# Patient Record
Sex: Female | Born: 1975 | Race: Black or African American | Hispanic: No | Marital: Married | Smoking: Never smoker
Health system: Southern US, Community
[De-identification: ages and names within clinical notes are randomized; demographics above are authoritative.]

## PROBLEM LIST (undated history)

## (undated) HISTORY — PX: TUBAL LIGATION: SHX77

---

## 1998-01-12 ENCOUNTER — Emergency Department (HOSPITAL_COMMUNITY): Admission: EM | Admit: 1998-01-12 | Discharge: 1998-01-12 | Payer: Self-pay | Admitting: Emergency Medicine

## 1998-03-03 ENCOUNTER — Ambulatory Visit (HOSPITAL_COMMUNITY): Admission: RE | Admit: 1998-03-03 | Discharge: 1998-03-03 | Payer: Self-pay | Admitting: Family Medicine

## 2000-06-08 ENCOUNTER — Ambulatory Visit (HOSPITAL_COMMUNITY): Admission: RE | Admit: 2000-06-08 | Discharge: 2000-06-08 | Payer: Self-pay | Admitting: Family Medicine

## 2000-06-08 ENCOUNTER — Encounter: Payer: Self-pay | Admitting: Family Medicine

## 2000-06-08 ENCOUNTER — Encounter: Admission: RE | Admit: 2000-06-08 | Discharge: 2000-06-08 | Payer: Self-pay | Admitting: Family Medicine

## 2000-10-24 ENCOUNTER — Emergency Department (HOSPITAL_COMMUNITY): Admission: EM | Admit: 2000-10-24 | Discharge: 2000-10-24 | Payer: Self-pay | Admitting: Emergency Medicine

## 2000-10-25 ENCOUNTER — Ambulatory Visit (HOSPITAL_COMMUNITY): Admission: RE | Admit: 2000-10-25 | Discharge: 2000-10-25 | Payer: Self-pay | Admitting: Emergency Medicine

## 2001-04-19 ENCOUNTER — Encounter: Payer: Self-pay | Admitting: Obstetrics and Gynecology

## 2001-04-19 ENCOUNTER — Inpatient Hospital Stay (HOSPITAL_COMMUNITY): Admission: AD | Admit: 2001-04-19 | Discharge: 2001-04-19 | Payer: Self-pay | Admitting: Obstetrics and Gynecology

## 2001-04-23 ENCOUNTER — Encounter (INDEPENDENT_AMBULATORY_CARE_PROVIDER_SITE_OTHER): Payer: Self-pay

## 2001-04-23 ENCOUNTER — Inpatient Hospital Stay (HOSPITAL_COMMUNITY): Admission: AD | Admit: 2001-04-23 | Discharge: 2001-04-23 | Payer: Self-pay | Admitting: *Deleted

## 2001-04-26 ENCOUNTER — Ambulatory Visit: Admission: RE | Admit: 2001-04-26 | Discharge: 2001-04-26 | Payer: Self-pay | Admitting: Obstetrics and Gynecology

## 2001-04-26 ENCOUNTER — Encounter (INDEPENDENT_AMBULATORY_CARE_PROVIDER_SITE_OTHER): Payer: Self-pay | Admitting: *Deleted

## 2001-05-10 ENCOUNTER — Encounter: Payer: Self-pay | Admitting: Emergency Medicine

## 2001-05-10 ENCOUNTER — Emergency Department (HOSPITAL_COMMUNITY): Admission: EM | Admit: 2001-05-10 | Discharge: 2001-05-10 | Payer: Self-pay | Admitting: Emergency Medicine

## 2002-01-29 ENCOUNTER — Emergency Department (HOSPITAL_COMMUNITY): Admission: EM | Admit: 2002-01-29 | Discharge: 2002-01-29 | Payer: Self-pay | Admitting: Emergency Medicine

## 2002-02-09 ENCOUNTER — Inpatient Hospital Stay (HOSPITAL_COMMUNITY): Admission: AD | Admit: 2002-02-09 | Discharge: 2002-02-09 | Payer: Self-pay | Admitting: *Deleted

## 2002-02-12 ENCOUNTER — Inpatient Hospital Stay (HOSPITAL_COMMUNITY): Admission: AD | Admit: 2002-02-12 | Discharge: 2002-02-12 | Payer: Self-pay | Admitting: Obstetrics and Gynecology

## 2002-02-13 ENCOUNTER — Encounter: Payer: Self-pay | Admitting: Obstetrics and Gynecology

## 2002-02-13 HISTORY — PX: DILATION AND CURETTAGE OF UTERUS: SHX78

## 2002-03-28 ENCOUNTER — Other Ambulatory Visit: Admission: RE | Admit: 2002-03-28 | Discharge: 2002-03-28 | Payer: Self-pay | Admitting: Obstetrics and Gynecology

## 2002-05-06 ENCOUNTER — Encounter: Payer: Self-pay | Admitting: Obstetrics and Gynecology

## 2002-05-06 ENCOUNTER — Ambulatory Visit (HOSPITAL_COMMUNITY): Admission: RE | Admit: 2002-05-06 | Discharge: 2002-05-06 | Payer: Self-pay | Admitting: Obstetrics and Gynecology

## 2002-06-24 ENCOUNTER — Encounter: Payer: Self-pay | Admitting: Obstetrics and Gynecology

## 2002-06-24 ENCOUNTER — Ambulatory Visit (HOSPITAL_COMMUNITY): Admission: RE | Admit: 2002-06-24 | Discharge: 2002-06-24 | Payer: Self-pay | Admitting: Obstetrics and Gynecology

## 2002-08-12 ENCOUNTER — Encounter: Payer: Self-pay | Admitting: Obstetrics and Gynecology

## 2002-08-12 ENCOUNTER — Ambulatory Visit (HOSPITAL_COMMUNITY): Admission: RE | Admit: 2002-08-12 | Discharge: 2002-08-12 | Payer: Self-pay | Admitting: Obstetrics and Gynecology

## 2002-09-14 ENCOUNTER — Inpatient Hospital Stay (HOSPITAL_COMMUNITY): Admission: AD | Admit: 2002-09-14 | Discharge: 2002-09-16 | Payer: Self-pay | Admitting: Obstetrics and Gynecology

## 2002-09-15 ENCOUNTER — Encounter (INDEPENDENT_AMBULATORY_CARE_PROVIDER_SITE_OTHER): Payer: Self-pay

## 2002-11-30 ENCOUNTER — Emergency Department (HOSPITAL_COMMUNITY): Admission: EM | Admit: 2002-11-30 | Discharge: 2002-11-30 | Payer: Self-pay | Admitting: Emergency Medicine

## 2004-05-20 ENCOUNTER — Emergency Department (HOSPITAL_COMMUNITY): Admission: EM | Admit: 2004-05-20 | Discharge: 2004-05-20 | Payer: Self-pay | Admitting: Emergency Medicine

## 2004-05-21 ENCOUNTER — Emergency Department (HOSPITAL_COMMUNITY): Admission: EM | Admit: 2004-05-21 | Discharge: 2004-05-21 | Payer: Self-pay | Admitting: Emergency Medicine

## 2004-07-14 ENCOUNTER — Emergency Department: Payer: Self-pay | Admitting: Emergency Medicine

## 2004-07-26 ENCOUNTER — Emergency Department: Payer: Self-pay | Admitting: Emergency Medicine

## 2005-01-09 ENCOUNTER — Emergency Department (HOSPITAL_COMMUNITY): Admission: EM | Admit: 2005-01-09 | Discharge: 2005-01-09 | Payer: Self-pay | Admitting: Family Medicine

## 2005-11-05 ENCOUNTER — Emergency Department (HOSPITAL_COMMUNITY): Admission: EM | Admit: 2005-11-05 | Discharge: 2005-11-05 | Payer: Self-pay | Admitting: Emergency Medicine

## 2006-01-20 ENCOUNTER — Emergency Department (HOSPITAL_COMMUNITY): Admission: EM | Admit: 2006-01-20 | Discharge: 2006-01-20 | Payer: Self-pay | Admitting: Emergency Medicine

## 2007-10-08 ENCOUNTER — Encounter: Admission: RE | Admit: 2007-10-08 | Discharge: 2007-10-08 | Payer: Self-pay | Admitting: Orthopedic Surgery

## 2010-07-01 NOTE — Op Note (Signed)
[name], Kayla Andrews                         ACCOUNT NO.:  [id number]   MEDICAL RECORD NO.:  [id number]                   PATIENT TYPE:  INP   LOCATION:  9148                                 FACILITY:  [facility]   PHYSICIAN:  Hal Morales, M.D.             DATE OF BIRTH:  06-13-1975   DATE OF PROCEDURE:  09/15/2002  DATE OF DISCHARGE:                                 OPERATIVE REPORT   PREOPERATIVE DIAGNOSIS:  Desire for surgical sterilization.   POSTOPERATIVE DIAGNOSIS:  Desire for surgical sterilization.   OPERATION:  Postpartum tubal sterilization.   SURGEON:  Hal Morales, M.D.   ANESTHESIA:  General orotracheal.   ESTIMATED BLOOD LOSS:  Less than 25 mL.   COMPLICATIONS:  None.   FINDINGS:  The tubes appeared normal for the postpartum state.   DESCRIPTION OF PROCEDURE:  A discussion was held with the patient concerning  the indication for her procedure which is her desire for surgical  sterilization and the risks involved, which include but are not limited to,  anesthesia, bleeding, infection, damage to adjacent organs, and  sterilization failure resulting in subsequent pregnancy.  The patient  verbalized understanding of each of these risks and wished to proceed.  She  was thus taken to the operating room after appropriate identification and  placed on the operating table.   After the attainment of adequate general anesthesia, the abdomen and  perineum were prepped with multiple layers of Betadine.  A red Robinson  catheter was used to empty the bladder.  The abdomen was draped as a sterile  field.  A subumbilical injection of 0.25% Marcaine for a total of 10 mL was  undertaken.  A subumbilical incision was made and the abdomen opened in  layers to enter the peritoneum.  The left fallopian tube was then  identified, followed to his fimbriated end, then grasped at the isthmic  portion and elevated.  A suture of 2-0 chromic was placed through the  mesosalpinx and  tied fore and aft on the knuckle of tube.  A second ligature  was placed proximal to that and the intervening knuckle of tube excised.  The cut ends were cauterized and hemostasis noted to be adequate.  A similar  procedure was carried out on the opposite side.  The abdominal peritoneum  was closed in a pursestring fashion with a suture of 0 Vicryl.  The fascia  was closed in a running fashion with a suture of 0 Vicryl.  A subcutaneous  tissue suture of 2-0 chromic was used to reapproximate the subcutaneous  tissue and a subcuticular suture of 3-0 Vicryl used to close the skin  incision.  A Steri-Strip and sterile dressing were applied and the patient  awakened from  general anesthesia, then taken to the recovery room in satisfactory  condition having tolerated the procedure well with sponge and instrument  counts correct.   SPECIMENS:  Portions  of right and left tube were sent to pathology.                                               Hal Morales, M.D.    VPH/MEDQ  D:  09/15/2002  T:  09/15/2002  Job:  [id number]

## 2010-07-01 NOTE — Op Note (Signed)
[facility]  Patient:    BRENNLEY, Kayla Andrews Visit Number: [id number] MRN: [id number]          Service Type: OBS Location: [facility] Attending Physician:  Michael Litter Dictated by:   Janine Limbo, M.D. Proc. Date: 04/26/01 Admit Date:  04/23/2001 Discharge Date: 04/23/2001                             Operative Report  PREOPERATIVE DIAGNOSIS:       First trimester missed abortion.  POSTOPERATIVE DIAGNOSIS:      First trimester missed abortion.  PROCEDURE:                    Suction dilatation and evacuation.  SURGEON:                      Janine Limbo, M.D.  ANESTHESIA:                   Monitored anesthetic control with paracervical block using 0.5% Marcaine.  INDICATIONS:                  The patient is a 35 year old female, gravida 5, para 1-0-3-1 who presents with a first trimester missed AB.  She understands the indications for her procedure and she accepts the risks of (but not limited to) anesthetic complications, bleeding, infection and possible damage to surrounding organs.  FINDINGS:                     The patients blood type was O positive.  A small amount of products of conception was removed from within the uterine cavity.  The cervix was open.  DESCRIPTION OF PROCEDURE:     The patient was taken to the operating room, where she was given medications through her IV line.  The perineum and vagina were prepped with multiple layers of Betadine.  The bladder was drained of urine.  Examination under anesthesia was performed.  The patient was sterilely draped.  Paracervical block was placed using 10 cc of 0.5% Marcaine.  The uterus sounded to 7 cm.  The cervix was noted to be dilated.  The cervix was dilated further without difficulty.  The uterine cavity was then evacuated using a size 8 suction curet and then a medium sharp curet.  The cavity was felt to be clean at the end of our procedure.  Hemostasis was noted to  be adequate.  All instruments were removed.  Repeat examination showed a firm uterus that was approximately 6-8 weeks in size.  The patient was awakened from her anesthetic and taken to the recovery room in stable condition.  FOLLOW-UP INSTRUCTIONS:       The patient was given a prescription for Vicodin, 1-2 tablets every four hours as needed for pain.  She was given a total of 20 tablets.  No refills were authorized.  The patient will return to see Dr. Stefano Gaul in 2-3 weeks for follow-up examination.  She was given a copy of the postoperative instruction sheet as prepared by the [facility] for patients who have undergone a dilatation and curettage.  The patient is authorized to return to work on April 29, 2001. Dictated by:   Janine Limbo, M.D. Attending Physician:  Michael Litter DD:  04/26/01 TD:  04/27/01 Job: [id number]

## 2010-07-01 NOTE — Discharge Summary (Signed)
NAME:  Kayla Andrews, Kayla Andrews                         ACCOUNT NO.:  [id number]   MEDICAL RECORD NO.:  [id number]                   PATIENT TYPE:  INP   LOCATION:  9148                                 FACILITY:  [facility]   PHYSICIAN:  Naima A. Dillard, M.D.              DATE OF BIRTH:  07/21/1975   DATE OF ADMISSION:  09/14/2002  DATE OF DISCHARGE:  09/16/2002                                 DISCHARGE SUMMARY   ADMISSION DIAGNOSES:  1. Intrauterine pregnancy at 67 and one-sevenths weeks.  2. Early active labor.   DISCHARGE DIAGNOSES:  1. Intrauterine pregnancy at 68 and one-sevenths weeks.  2. Early active labor.  3. Status post precipitous home delivery of a viable female infant named     Chrissie Noa weighing 7 pounds 3 ounces, Apgars 9 and 9.  4. Desires elective sterilization.  5. Status post elective bilateral tubal ligation.   HOSPITAL PROCEDURES:  1. Precipitous home delivery, spontaneous vaginal delivery of a female infant.  2. General orotracheal anesthesia.  3. Postpartum bilateral tubal ligation.   HOSPITAL COURSE:  The patient was admitted initially in early active labor  and was contracting every five minutes and her cervix was 4 cm, 70% effaced,  and -2 station.  She was admitted to labor and delivery and through the  night her contractions decreased and became milder.  The decision was made  at that point to discharge her home because her cervix had remained  unchanged and her contractions had become irregular.  She was given Ambien  to take for sleep.  Signs and symptoms of labor were reviewed and the  patient was discharged approximately 11 a.m.  As she proceeded home her  labor began to strengthen and progress.  Once she arrived at home her  contractions had increased significantly and EMS was called.  As they  arrived they delivered the female infant precipitously.  The patient and baby  were transported to the hospital where the placenta was delivered  spontaneously without  complications.  The perineum was found to be intact  except for a first degree vaginal laceration which was well approximated and  not bleeding and left unrepaired.  On postpartum day #1 the patient  underwent an elective bilateral tubal ligation under general anesthesia by  Dr. Pennie Rushing with no complications.  On postpartum day #2 and postoperative  day #1 the patient was ready to go home.  She was breast feeding well, vital  signs were stable.  She was afebrile, heart rate regular rate and rhythm,  chest clear.  Fundus was firm, lochia small to moderate, umbilical incision  was clean and intact.  Abdomen was soft and appropriately tender.  Extremities were within normal limits and she was deemed to have received  full benefit of her hospital stay and was discharged home.   DISCHARGE LABORATORY DATA:  White blood cell count 11.2, hemoglobin 10.5,  platelets 240.  DISCHARGE MEDICATIONS:  1. Motrin 600 mg p.o. q.6h. p.r.n.  2. Tylox one to two p.o. q.4h. p.r.n.    DISCHARGE INSTRUCTIONS:  Per [facility] handout.   DISCHARGE FOLLOW-UP:  In six weeks or p.r.n.     Marie L. Williams, C.N.M.                 Naima A. Normand Sloop, M.D.    MLW/MEDQ  D:  09/16/2002  T:  09/16/2002  Job:  [id number]

## 2010-07-01 NOTE — Discharge Summary (Signed)
   NAME:  Kayla Andrews, Kayla Andrews                         ACCOUNT NO.:  [id number]   MEDICAL RECORD NO.:  [id number]                   PATIENT TYPE:  INP   LOCATION:  9167                                 FACILITY:  [facility]   PHYSICIAN:  Naima A. Dillard, M.D.              DATE OF BIRTH:  01-Feb-1976   DATE OF ADMISSION:  09/14/2002  DATE OF DISCHARGE:  09/14/2002                                 DISCHARGE SUMMARY   Ms. [name] is a gravida 6, para 1, 0, 4, 1 at [redacted] weeks gestation who  presented earlier this morning with regular uterine contractions.  She was  found to be 4 cm dilated at that time and was admitted in labor.  Since that  time her baby has remained reactive and her contractions have become  irregular with uterine irritability predominantly.  She is sleeping soundly  with this irritability and her labor has not progressed.  On cervical exam  her cervix is found to be 4 cm dilated, 70% effaced with the cephalic  presenting part at a -2 station which is unchanged.  Membranes are intact.  The patient's vital signs are stable and she is to be discharged home.   DISCHARGE MEDICATIONS:  Ambien 10 mg p.o.   DISCHARGE INSTRUCTIONS:  She will call for any increased signs or symptoms  of labor, any warning signs or any problems or concerns.  Otherwise she will  keep her next schedule appointment at the office [facility] this week.       Rica Koyanagi, C.N.M.               Naima A. Normand Sloop, M.D.    SDM/MEDQ  D:  09/14/2002  T:  09/15/2002  Job:  [id number]

## 2010-07-01 NOTE — H&P (Signed)
NAME:  Kayla Andrews, Kayla Andrews                         ACCOUNT NO.:  [id number]   MEDICAL RECORD NO.:  [id number]                   PATIENT TYPE:  INP   LOCATION:  9148                                 [facility]   PHYSICIAN:  Naima A. Dillard, M.D.              DATE OF BIRTH:  28-Jul-1975   DATE OF ADMISSION:  09/14/2002  DATE OF DISCHARGE:                                HISTORY & PHYSICAL   HISTORY OF PRESENT ILLNESS:  Ms. Kayla Andrews is a 35 year old single black  female gravida 6, para 1, 0/4/1 at 39-1/7 weeks who presents with regular  uterine contractions every 5 minutes since this morning. She denies leaking,  bleeding, headache, nausea and vomiting, visual disturbances. Her pregnancy  has been followed by the [facility]  [facility] and has been remarkable for 1. Late  transfer. 2. Obesity.  3. AB x4.  4. Group B strep negative.   PRENATAL LABORATORY DATA:  Her prenatal labs were collected on, I am not  sure what date, but they are hemoglobin 11.5, hematocrit 33.5, platelets  235,000. Blood type O positive. Antibody negative. RPR non-reactive. Rubella  immune. Hepatitis B surface antigen negative. HIV non-reactive. Cystic  fibrosis negative. Gonorrhea negative. Chlamydia negative. Her quad screen  was within normal limits. On Jun 19, 2002 her 1 hour Glucola was 103 and her  hemoglobin at that time was 11.6. Culture of the vaginal tract for group B  strep on August 26, 2002 was negative and her gonorrhea and Chlamydia at that  same time was negative.   HOSPITAL COURSE:  She presented for care at [facility] on March 28, 2002 at [redacted] weeks gestation. She had an 18 week Glucola secondary to obesity  that was within normal limits. Pregnancy ultrasonography at [redacted] weeks  gestation showed growth consistent with previous dating. Second pregnancy  ultrasonography at [redacted] weeks gestation showed estimated fetal weight at 75th  to 90th  percentile and all anatomy was visualized. Pregnancy ultrasonography  at [redacted] weeks gestation showed growth consistent with previous dating. The  rest of her prenatal care was unremarkable.   OBSTETRIC HISTORY:  She is a gravida 6, para 1, 0/4/1 and in November of  1997, had a vaginal delivery of a female infant weighing 7 pounds and 8  ounces at [redacted] weeks gestation. After 6 hours of labor, she had an epidural  for anesthesia. Infant's name was [place] in 1998 and in 2003, she had SAB's.  In 2002 and 2003 had EAB's.   ALLERGIES:  PENICILLIN (allergy resulting in hives.   PAST MEDICAL HISTORY:  She has used condoms in the past for contraception.  She reports having had the usual childhood illnesses. She has an occasional  urinary tract infection. She has a history of marijuana use.   FAMILY HISTORY:  Remarkable for a father with heart disease and myocardial  infarction as  well as chronic hypertension and varicosities. Daughter with  history of asthma. Father with diabetes. Maternal grandfather with liver  cancer. Maternal grandmother with brain cancer. Maternal grandfather with  history of cerebrovascular. Brother with seizure disorder of childhood.   GENETIC HISTORY:  The patient's maternal aunt had twins.   SOCIAL HISTORY:  Father of the baby is involved. His name is Kayla Andrews. The  patient has 2 years of college education. Father of the baby is high school  educated and both are employed full time. They deny any alcohol, tobacco or  illicit drug use.   PHYSICAL EXAMINATION:  VITAL SIGNS: Stable. She is afebrile.  HEENT: Within normal limits.  CHEST: Clear to auscultation.  HEART: Regular rate and rhythm.  ABDOMEN: Gravid and contour with fundal height extending approximately 39 cm  above the pubic synthesis. Electronic fetal monitoring shows reassuring  fetal heart rate. Uterine contractions every 5 minutes. Cervical examination  4 cm, 70%, vertex, and minus 2.  EXTREMITIES: Within  normal limits.   ASSESSMENT:  1. Intrauterine pregnancy  at term.  2. Early active labor.   PLAN:  1. Admit to birthing suite for comfort with Dr. Normand Sloop.  2. Routine Certified Nurse Midwife orders.  3. Anticipate normal spontaneous vaginal birth.     Cam Hai, C.N.M.                     Naima A. Normand Sloop, M.D.    KS/MEDQ  D:  09/14/2002  T:  09/14/2002  Job:  [id number]

## 2012-12-09 ENCOUNTER — Emergency Department (INDEPENDENT_AMBULATORY_CARE_PROVIDER_SITE_OTHER)
Admission: EM | Admit: 2012-12-09 | Discharge: 2012-12-09 | Disposition: A | Discharge status: Home / Self Care | Payer: Self-pay | Source: Home / Self Care | Attending: Family Medicine | Admitting: Family Medicine

## 2012-12-09 ENCOUNTER — Encounter (HOSPITAL_COMMUNITY): Payer: Self-pay | Admitting: Emergency Medicine

## 2012-12-09 DIAGNOSIS — M549 Dorsalgia, unspecified: Secondary | ICD-10-CM

## 2012-12-09 DIAGNOSIS — M542 Cervicalgia: Secondary | ICD-10-CM
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
## 2013-11-06 ENCOUNTER — Encounter (HOSPITAL_COMMUNITY): Payer: Self-pay | Admitting: Emergency Medicine

## 2013-11-06 ENCOUNTER — Emergency Department (HOSPITAL_COMMUNITY)
Admission: EM | Admit: 2013-11-06 | Discharge: 2013-11-07 | Disposition: A | Discharge status: Home / Self Care | Payer: Self-pay | Attending: Emergency Medicine | Admitting: Emergency Medicine

## 2013-11-06 DIAGNOSIS — Z23 Encounter for immunization: Secondary | ICD-10-CM | POA: Insufficient documentation

## 2013-11-06 DIAGNOSIS — S0083XA Contusion of other part of head, initial encounter: Secondary | ICD-10-CM | POA: Insufficient documentation

## 2013-11-06 DIAGNOSIS — S0993XA Unspecified injury of face, initial encounter: Secondary | ICD-10-CM | POA: Insufficient documentation

## 2013-11-06 DIAGNOSIS — S0181XA Laceration without foreign body of other part of head, initial encounter: Secondary | ICD-10-CM

## 2013-11-06 DIAGNOSIS — S199XXA Unspecified injury of neck, initial encounter: Secondary | ICD-10-CM

## 2013-11-06 DIAGNOSIS — S0003XA Contusion of scalp, initial encounter: Secondary | ICD-10-CM | POA: Insufficient documentation

## 2013-11-06 DIAGNOSIS — R51 Headache: Secondary | ICD-10-CM | POA: Insufficient documentation

## 2013-11-06 DIAGNOSIS — S1093XA Contusion of unspecified part of neck, initial encounter: Secondary | ICD-10-CM

## 2013-11-06 DIAGNOSIS — S058X9A Other injuries of unspecified eye and orbit, initial encounter: Secondary | ICD-10-CM | POA: Insufficient documentation

## 2013-11-06 MED ORDER — TETANUS-DIPHTH-ACELL PERTUSSIS 5-2.5-18.5 LF-MCG/0.5 IM SUSP
0.5000 mL | Freq: Once | INTRAMUSCULAR | Status: AC
  Administered 2013-11-06: 0.5 mL via INTRAMUSCULAR
  Filled 2013-11-06: qty 0.5

## 2013-11-06 MED ORDER — ONDANSETRON 4 MG PO TBDP
4.0000 mg | ORAL_TABLET | Freq: Once | ORAL | Status: AC
  Administered 2013-11-06: 4 mg via ORAL
  Filled 2013-11-06: qty 1

## 2013-11-06 MED ORDER — ACETAMINOPHEN 325 MG PO TABS
650.0000 mg | ORAL_TABLET | Freq: Once | ORAL | Status: AC
  Administered 2013-11-06: 650 mg via ORAL
  Filled 2013-11-06: qty 2

## 2013-11-06 MED ORDER — TETRACAINE HCL 0.5 % OP SOLN
2.0000 [drp] | Freq: Once | OPHTHALMIC | Status: AC
  Administered 2013-11-06: 2 [drp] via OPHTHALMIC
  Filled 2013-11-06: qty 2

## 2013-11-06 MED ORDER — FLUORESCEIN SODIUM 1 MG OP STRP
1.0000 | ORAL_STRIP | Freq: Once | OPHTHALMIC | Status: AC
  Administered 2013-11-06: via OPHTHALMIC
  Filled 2013-11-06: qty 1
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
## 2013-11-06 NOTE — ED Notes (Signed)
Patient stated that her head is hurting and is feeling nauseated.

## 2013-11-06 NOTE — ED Notes (Signed)
Ice pack given for left eye.

## 2013-11-07 ENCOUNTER — Encounter (HOSPITAL_COMMUNITY): Payer: Self-pay | Admitting: Radiology

## 2013-11-07 ENCOUNTER — Emergency Department (HOSPITAL_COMMUNITY): Payer: Self-pay

## 2013-11-07 MED ORDER — OXYCODONE-ACETAMINOPHEN 5-325 MG PO TABS: 1.0000 | ORAL_TABLET | Freq: Four times a day (QID) | ORAL | Status: DC | PRN

## 2013-11-07 MED ORDER — OXYCODONE-ACETAMINOPHEN 5-325 MG PO TABS: 1.0000 | ORAL_TABLET | Freq: Four times a day (QID) | ORAL | Status: AC | PRN
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
## 2013-11-07 NOTE — Discharge Instructions (Signed)
Contusion °A contusion is a deep bruise. Contusions are the result of an injury that caused bleeding under the skin. The contusion may turn blue, purple, or yellow. Minor injuries will give you a painless contusion, but more severe contusions may stay painful and swollen for a few weeks.  °CAUSES  °A contusion is usually caused by a blow, trauma, or direct force to an area of the body. °SYMPTOMS  °· Swelling and redness of the injured area. °· Bruising of the injured area. °· Tenderness and soreness of the injured area. °· Pain. °DIAGNOSIS  °The diagnosis can be made by taking a history and physical exam. An X-ray, CT scan, or MRI may be needed to determine if there were any associated injuries, such as fractures. °TREATMENT  °Specific treatment will depend on what area of the body was injured. In general, the best treatment for a contusion is resting, icing, elevating, and applying cold compresses to the injured area. Over-the-counter medicines may also be recommended for pain control. Ask your caregiver what the best treatment is for your contusion. °HOME CARE INSTRUCTIONS  °· Put ice on the injured area. °¨ Put ice in a plastic bag. °¨ Place a towel between your skin and the bag. °¨ Leave the ice on for 15-20 minutes, 3-4 times a day, or as directed by your health care provider. °· Only take over-the-counter or prescription medicines for pain, discomfort, or fever as directed by your caregiver. Your caregiver may recommend avoiding anti-inflammatory medicines (aspirin, ibuprofen, and naproxen) for 48 hours because these medicines may increase bruising. °· Rest the injured area. °· If possible, elevate the injured area to reduce swelling. °SEEK IMMEDIATE MEDICAL CARE IF:  °· You have increased bruising or swelling. °· You have pain that is getting worse. °· Your swelling or pain is not relieved with medicines. °MAKE SURE YOU:  °· Understand these instructions. °· Will watch your condition. °· Will get help right  away if you are not doing well or get worse. °Document Released: 11/09/2004 Document Revised: 02/04/2013 Document Reviewed: 12/05/2010 °[employer]® Patient Information ©2015 [employer]. This information is not intended to replace advice given to you by your health care provider. Make sure you discuss any questions you have with your health care provider. ° °
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
## 2016-06-06 ENCOUNTER — Ambulatory Visit (HOSPITAL_COMMUNITY)
Admission: EM | Admit: 2016-06-06 | Discharge: 2016-06-06 | Disposition: A | Discharge status: Home / Self Care | Payer: Medicaid Other | Attending: Internal Medicine | Admitting: Internal Medicine

## 2016-06-06 ENCOUNTER — Encounter (HOSPITAL_COMMUNITY): Payer: Self-pay | Admitting: Emergency Medicine

## 2016-06-06 DIAGNOSIS — R059 Cough, unspecified: Secondary | ICD-10-CM

## 2016-06-06 DIAGNOSIS — J9801 Acute bronchospasm: Secondary | ICD-10-CM | POA: Diagnosis not present

## 2016-06-06 DIAGNOSIS — R05 Cough: Secondary | ICD-10-CM

## 2016-06-06 DIAGNOSIS — J301 Allergic rhinitis due to pollen: Secondary | ICD-10-CM | POA: Diagnosis not present

## 2016-06-06 DIAGNOSIS — R0982 Postnasal drip: Secondary | ICD-10-CM | POA: Diagnosis not present

## 2016-06-06 MED ORDER — ALBUTEROL SULFATE HFA 108 (90 BASE) MCG/ACT IN AERS: 2.0000 | INHALATION_SPRAY | RESPIRATORY_TRACT | 0 refills | Status: AC | PRN

## 2016-06-06 MED ORDER — PREDNISONE 50 MG PO TABS: ORAL_TABLET | ORAL | 0 refills | Status: DC

## 2016-06-06 NOTE — ED Triage Notes (Signed)
Pt has been suffering from a sore throat, cough, nasal congestion and a headache for 3 days.

## 2016-06-06 NOTE — ED Provider Notes (Signed)
CSN: [id number]     Arrival date & time 06/06/16  1131 History   First MD Initiated Contact with Patient 06/06/16 1236     Chief Complaint  Patient presents with  . URI   (Consider location/radiation/quality/duration/timing/severity/associated sxs/prior Treatment) Severely obese 41 year old female accompanied by 2 siblings with similar symptoms complains of PND, cough, body aches, sore throat, headache, cough with phlegm for 3 days. She has had some sweating and felt hot and assumed that she maybe had a fever although she has not measured it. Denies earache. She has taken Mucinex and Flonase but no apparent improvement.      History reviewed. No pertinent past medical history. Past Surgical History:  Procedure Laterality Date  . DILATION AND CURETTAGE OF UTERUS  2004  . TUBAL LIGATION     History reviewed. No pertinent family history. Social History  Substance Use Topics  . Smoking status: Never Smoker  . Smokeless tobacco: Never Used  . Alcohol use No   OB History    No data available     Review of Systems  Constitutional: Negative for activity change, appetite change, chills, fatigue and fever.  HENT: Positive for congestion, postnasal drip, rhinorrhea and sore throat. Negative for facial swelling.   Eyes: Negative.   Respiratory: Positive for cough.   Cardiovascular: Negative.   Musculoskeletal: Negative for neck pain and neck stiffness.  Skin: Negative for pallor and rash.  Neurological: Positive for headaches.  All other systems reviewed and are negative.   Allergies  Penicillins  Home Medications   Prior to Admission medications   Medication Sig Start Date End Date Taking? Authorizing Provider  albuterol (PROVENTIL HFA;VENTOLIN HFA) 108 (90 Base) MCG/ACT inhaler Inhale 2 puffs into the lungs every 4 (four) hours as needed for wheezing or shortness of breath. 06/06/16   Hayden Rasmussen, NP  oxyCODONE-acetaminophen (PERCOCET) 5-325 MG per tablet Take 1 tablet by mouth  every 6 (six) hours as needed. 11/07/13   Purvis Sheffield, MD  predniSONE (DELTASONE) 50 MG tablet 1 tab po daily for 6 days. Take with food. 06/06/16   Hayden Rasmussen, NP   Meds Ordered and Administered this Visit  Medications - No data to display  BP 140/68 (BP Location: Right Arm)   Pulse 82   Temp 98.4 F (36.9 C) (Oral)   LMP 05/14/2016 (Exact Date)   SpO2 98%  No data found.   Physical Exam  Constitutional: She is oriented to person, place, and time. She appears well-developed and well-nourished. No distress.  HENT:  Head: Normocephalic and atraumatic.  Bilateral TMs are retracted. No erythema Oropharynx with minor erythema, moderate amount of clear PND. No swelling or exudates.  Eyes: EOM are normal.  Neck: Normal range of motion. Neck supple.  Cardiovascular: Normal rate, regular rhythm, normal heart sounds and intact distal pulses.   Pulmonary/Chest: Effort normal. No respiratory distress.  Good expansion and air movement however with deep breathing there is produced cough spasms. And expiratory wheeze and coarseness.  Musculoskeletal: Normal range of motion. She exhibits no edema.  Lymphadenopathy:    She has no cervical adenopathy.  Neurological: She is alert and oriented to person, place, and time.  Skin: Skin is warm and dry. No rash noted.  Psychiatric: She has a normal mood and affect.  Nursing note and vitals reviewed.   Urgent Care Course     Procedures (including critical care time)  Labs Review Labs Reviewed - No data to display  Imaging Review No results found.  Visual Acuity Review  Right Eye Distance:   Left Eye Distance:   Bilateral Distance:    Right Eye Near:   Left Eye Near:    Bilateral Near:         MDM   1. Seasonal allergic rhinitis due to pollen   2. PND (post-nasal drip)   3. Cough   4. Bronchospasm    Your cough is due to combination of mild bronchospasm or wheezing associated with lots of drainage in the back of your  throat. You likely have a bad case of allergies or possibly a viral type illness. At this time of the year both of these are very prevalent and have similar symptoms. Sudafed PE 10 mg every 4 to 6 hours as needed for congestion Allegra or Zyrtec daily as needed for drainage and runny nose. For stronger antihistamine may take Chlor-Trimeton 2 to 4 mg every 4 to 6 hours, may cause drowsiness. Saline nasal spray used frequently. Ibuprofen 600 mg every 6 hours as needed for pain, discomfort or fever. Drink plenty of fluids and stay well-hydrated. Flonase or Rhinocort nasal spray daily Meds ordered this encounter  Medications  . albuterol (PROVENTIL HFA;VENTOLIN HFA) 108 (90 Base) MCG/ACT inhaler    Sig: Inhale 2 puffs into the lungs every 4 (four) hours as needed for wheezing or shortness of breath.    Dispense:  1 Inhaler    Refill:  0    Order Specific Question:   Supervising Provider    Answer:   Eustace Moore [[id number]]  . predniSONE (DELTASONE) 50 MG tablet    Sig: 1 tab po daily for 6 days. Take with food.    Dispense:  6 tablet    Refill:  0    Order Specific Question:   Supervising Provider    Answer:   Eustace Moore [[id number]]       Hayden Rasmussen, NP 06/06/16 1324

## 2016-06-06 NOTE — Discharge Instructions (Signed)
Your cough is due to combination of mild bronchospasm or wheezing associated with lots of drainage in the back of your throat. You likely have a bad case of allergies or possibly a viral type illness. At this time of the year both of these are very prevalent and have similar symptoms. Sudafed PE 10 mg every 4 to 6 hours as needed for congestion Allegra or Zyrtec daily as needed for drainage and runny nose. For stronger antihistamine may take Chlor-Trimeton 2 to 4 mg every 4 to 6 hours, may cause drowsiness. Saline nasal spray used frequently. Ibuprofen 600 mg every 6 hours as needed for pain, discomfort or fever. Drink plenty of fluids and stay well-hydrated. Flonase or Rhinocort nasal spray daily

## 2017-05-05 ENCOUNTER — Encounter (HOSPITAL_COMMUNITY): Payer: Self-pay | Admitting: Emergency Medicine

## 2017-05-05 ENCOUNTER — Other Ambulatory Visit: Payer: Self-pay

## 2017-05-05 ENCOUNTER — Ambulatory Visit (HOSPITAL_COMMUNITY)
Admission: EM | Admit: 2017-05-05 | Discharge: 2017-05-05 | Disposition: A | Discharge status: Home / Self Care | Payer: Self-pay | Attending: Family Medicine | Admitting: Family Medicine

## 2017-05-05 DIAGNOSIS — M7918 Myalgia, other site: Secondary | ICD-10-CM

## 2017-05-05 MED ORDER — HYDROCODONE-ACETAMINOPHEN 5-325 MG PO TABS: 1.0000 | ORAL_TABLET | Freq: Four times a day (QID) | ORAL | 0 refills | Status: AC | PRN

## 2017-05-05 MED ORDER — MELOXICAM 15 MG PO TABS: 15.0000 mg | ORAL_TABLET | Freq: Every day | ORAL | 1 refills | Status: AC
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
